# Patient Record
Sex: Male | Born: 1985 | Race: White | Hispanic: No | Marital: Married | Smoking: Former smoker
Health system: Southern US, Community
[De-identification: ages and names within clinical notes are randomized; demographics above are authoritative.]

## PROBLEM LIST (undated history)

## (undated) DIAGNOSIS — I10 Essential (primary) hypertension: Secondary | ICD-10-CM

---

## 2009-08-11 ENCOUNTER — Emergency Department (HOSPITAL_COMMUNITY): Admission: EM | Admit: 2009-08-11 | Discharge: 2009-08-11 | Payer: Self-pay | Admitting: Emergency Medicine

## 2010-10-15 ENCOUNTER — Emergency Department: Payer: Self-pay | Admitting: Emergency Medicine

## 2012-04-11 ENCOUNTER — Emergency Department: Payer: Self-pay | Admitting: Emergency Medicine

## 2012-04-11 LAB — URINALYSIS, COMPLETE
Ketone: NEGATIVE
Leukocyte Esterase: NEGATIVE
Nitrite: NEGATIVE
Protein: NEGATIVE
RBC,UR: 57 /HPF (ref 0–5)
WBC UR: 2 /HPF (ref 0–5)

## 2012-08-29 ENCOUNTER — Emergency Department: Payer: Self-pay | Admitting: Emergency Medicine

## 2013-06-25 ENCOUNTER — Other Ambulatory Visit: Payer: Self-pay | Admitting: Interventional Cardiology

## 2013-06-25 DIAGNOSIS — R079 Chest pain, unspecified: Secondary | ICD-10-CM

## 2013-06-25 DIAGNOSIS — R0602 Shortness of breath: Secondary | ICD-10-CM

## 2013-06-27 ENCOUNTER — Ambulatory Visit (HOSPITAL_COMMUNITY): Payer: 59 | Attending: Cardiology | Admitting: Cardiology

## 2013-06-27 DIAGNOSIS — R0989 Other specified symptoms and signs involving the circulatory and respiratory systems: Secondary | ICD-10-CM | POA: Insufficient documentation

## 2013-06-27 DIAGNOSIS — R0609 Other forms of dyspnea: Secondary | ICD-10-CM | POA: Insufficient documentation

## 2013-06-27 DIAGNOSIS — R079 Chest pain, unspecified: Secondary | ICD-10-CM

## 2013-06-27 DIAGNOSIS — R0602 Shortness of breath: Secondary | ICD-10-CM

## 2013-06-27 DIAGNOSIS — R0789 Other chest pain: Secondary | ICD-10-CM | POA: Insufficient documentation

## 2013-06-27 NOTE — Progress Notes (Signed)
Echo performed. 

## 2020-04-24 ENCOUNTER — Encounter (HOSPITAL_COMMUNITY): Payer: Self-pay | Admitting: Urgent Care

## 2020-04-24 ENCOUNTER — Other Ambulatory Visit: Payer: Self-pay

## 2020-04-24 ENCOUNTER — Encounter
Admission: RE | Admit: 2020-04-24 | Discharge: 2020-04-24 | Disposition: A | Discharge status: Home / Self Care | Payer: PRIVATE HEALTH INSURANCE | Source: Ambulatory Visit | Attending: Otolaryngology | Admitting: Otolaryngology

## 2020-04-24 HISTORY — DX: Essential (primary) hypertension: I10

## 2020-04-24 NOTE — Patient Instructions (Signed)
COVID TESTING Date: April 28, 2020 Monday  Testing site:  [facility] [address] Thru Hours:  9:03 am - 1:00 pm Once you are tested, you are asked to stay quarantined (avoiding public places) until after your surgery.   Your procedure is scheduled on: April 30, 2020 Wednesday  Report to Day Surgery on the 2nd floor of the [facility]. To find out your arrival time, please call [phone] between 1PM - 3PM on: TUESDAY  REMEMBER: Instructions that are not followed completely may result in serious medical risk, up to and including death; or upon the discretion of your surgeon and anesthesiologist your surgery may need to be rescheduled.  Do not eat food after midnight the night before surgery.  No gum chewing, lozengers or hard candies.  You may however, drink CLEAR liquids up to 2 hours before you are scheduled to arrive for your surgery. Do not drink anything within 2 hours of your scheduled arrival time.  Clear liquids include: - water  - apple juice without pulp - gatorade (not RED) - black coffee or tea (Do NOT add milk or creamers to the coffee or tea) Do NOT drink anything that is not on this list.  Type 1 and Type 2 diabetics should only drink water.    TAKE THESE MEDICATIONS THE MORNING OF SURGERY WITH A SIP OF WATER: NONE    Stop Anti-inflammatories (NSAIDS) such as Advil, Aleve, Ibuprofen, Motrin, Naproxen, Naprosyn and ASPIRIN OR Aspirin based products such as Excedrin, Goodys Powder, BC Powder. (May take Tylenol or Acetaminophen if needed.)  Stop ANY OVER THE COUNTER supplements until after surgery. (May continue Vitamin D, Vitamin B, and multivitamin.)  No Alcohol for 24 hours before or after surgery.  No Smoking including e-cigarettes for 24 hours prior to surgery.  No chewable tobacco products for at least 6 hours prior to surgery.  No nicotine patches on the day of surgery.  Do not use any "recreational" drugs for at  least a week prior to your surgery.  Please be advised that the combination of cocaine and anesthesia may have negative outcomes, up to and including death. If you test positive for cocaine, your surgery will be cancelled.  On the morning of surgery brush your teeth with toothpaste and water, you may rinse your mouth with mouthwash if you wish. Do not swallow any toothpaste or mouthwash.  Do not wear jewelry, make-up, hairpins, clips or nail polish.  Do not wear lotions, powders, or perfumes.   Do not shave 48 hours prior to surgery.   Contact lenses, hearing aids and dentures may not be worn into surgery.  Do not bring valuables to the hospital. [facility] is not responsible for any missing/lost belongings or valuables.   TAKE SHOWER THE MORNING OF SURGERY  Notify your doctor if there is any change in your medical condition (cold, fever, infection).  Wear comfortable clothing (specific to your surgery type) to the hospital.  Plan for stool softeners for home use; pain medications have a tendency to cause constipation. You can also help prevent constipation by eating foods high in fiber such as fruits and vegetables and drinking plenty of fluids as your diet allows.  After surgery, you can help prevent lung complications by doing breathing exercises.  Take deep breaths and cough every 1-2 hours. Your doctor may order a device called an Incentive Spirometer to help you take deep breaths. When coughing or sneezing, hold a pillow firmly against your  incision with both hands. This is called "splinting." Doing this helps protect your incision. It also decreases belly discomfort.  If you are being discharged the day of surgery, you will not be allowed to drive home. You will need a responsible adult (18 years or older) to drive you home and stay with you that night.   Please call the Pre-admissions Testing Dept. at [phone] if you have any questions about these  instructions.  Visitation Policy:  Patients undergoing a surgery or procedure may have one family member or support person with them as long as that person is not COVID-19 positive or experiencing its symptoms.  That person may remain in the waiting area during the procedure.  Children under 27 years of age may have both parents or legal guardians with them during their procedure.  Inpatient Visitation Update:   Two designated support people may visit a patient during visiting hours 7 am to 8 pm. It must be the same two designated people for the duration of the patient stay. The visitors may come and go during the day, and there is no switching out to have different visitors. A mask must be worn at all times, including in the patient room.  Children under 24 years of age:  a total of 4 designated visitors for the child's entire stay are allowed. Only 2 in the room at a time and only one staying overnight at a time. The overnight guest can now rotate during the child's hospital stay.  As a reminder, masks are still required for all [facility] team members, patients and visitors in all [facility] facilities.   Systemwide, no visitors 17 or younger.

## 2020-04-28 ENCOUNTER — Other Ambulatory Visit: Payer: Self-pay

## 2020-04-30 ENCOUNTER — Encounter: Admission: RE | Payer: Self-pay | Source: Home / Self Care

## 2020-04-30 ENCOUNTER — Ambulatory Visit
Admission: RE | Admit: 2020-04-30 | Payer: PRIVATE HEALTH INSURANCE | Source: Home / Self Care | Admitting: Otolaryngology

## 2020-04-30 SURGERY — TONSILLECTOMY
Anesthesia: General | Laterality: Bilateral

## 2021-01-16 ENCOUNTER — Other Ambulatory Visit: Payer: Self-pay | Admitting: Physical Medicine and Rehabilitation

## 2021-01-16 DIAGNOSIS — M5416 Radiculopathy, lumbar region: Secondary | ICD-10-CM

## 2021-01-30 ENCOUNTER — Other Ambulatory Visit: Payer: Self-pay

## 2021-01-30 ENCOUNTER — Ambulatory Visit
Admission: RE | Admit: 2021-01-30 | Discharge: 2021-01-30 | Disposition: A | Discharge status: Home / Self Care | Payer: PRIVATE HEALTH INSURANCE | Source: Ambulatory Visit | Attending: Physical Medicine and Rehabilitation | Admitting: Physical Medicine and Rehabilitation

## 2021-01-30 DIAGNOSIS — M5416 Radiculopathy, lumbar region: Secondary | ICD-10-CM

## 2021-03-13 ENCOUNTER — Other Ambulatory Visit: Payer: Self-pay | Admitting: Physical Medicine & Rehabilitation

## 2021-03-13 DIAGNOSIS — M25559 Pain in unspecified hip: Secondary | ICD-10-CM

## 2021-03-16 ENCOUNTER — Other Ambulatory Visit: Payer: Self-pay

## 2021-03-16 ENCOUNTER — Ambulatory Visit
Admission: RE | Admit: 2021-03-16 | Discharge: 2021-03-16 | Disposition: A | Discharge status: Home / Self Care | Payer: PRIVATE HEALTH INSURANCE | Source: Ambulatory Visit | Attending: Physical Medicine & Rehabilitation | Admitting: Physical Medicine & Rehabilitation

## 2021-03-16 DIAGNOSIS — M25559 Pain in unspecified hip: Secondary | ICD-10-CM

## 2021-03-25 ENCOUNTER — Other Ambulatory Visit: Payer: Self-pay | Admitting: Physical Medicine & Rehabilitation

## 2021-03-25 DIAGNOSIS — M25552 Pain in left hip: Secondary | ICD-10-CM

## 2021-03-31 ENCOUNTER — Inpatient Hospital Stay: Admission: RE | Admit: 2021-03-31 | Payer: PRIVATE HEALTH INSURANCE | Source: Ambulatory Visit

## 2021-04-15 ENCOUNTER — Inpatient Hospital Stay: Admission: RE | Admit: 2021-04-15 | Payer: PRIVATE HEALTH INSURANCE | Source: Ambulatory Visit

## 2021-05-01 ENCOUNTER — Other Ambulatory Visit: Payer: Self-pay

## 2021-05-01 ENCOUNTER — Ambulatory Visit
Admission: RE | Admit: 2021-05-01 | Discharge: 2021-05-01 | Disposition: A | Discharge status: Home / Self Care | Payer: PRIVATE HEALTH INSURANCE | Source: Ambulatory Visit | Attending: Physical Medicine & Rehabilitation | Admitting: Physical Medicine & Rehabilitation

## 2021-05-01 DIAGNOSIS — M25552 Pain in left hip: Secondary | ICD-10-CM

## 2021-05-01 MED ORDER — IOPAMIDOL (ISOVUE-M 200) INJECTION 41%
1.0000 mL | Freq: Once | INTRAMUSCULAR | Status: AC
  Administered 2021-05-01: 1 mL via INTRA_ARTICULAR

## 2021-05-01 MED ORDER — TRIAMCINOLONE ACETONIDE 40 MG/ML IJ SUSP (RADIOLOGY)
40.0000 mg | Freq: Once | INTRAMUSCULAR | Status: AC
  Administered 2021-05-01: 40 mg via INTRA_ARTICULAR

## 2022-03-12 ENCOUNTER — Other Ambulatory Visit: Payer: Self-pay

## 2022-03-12 MED ORDER — WEGOVY 1 MG/0.5ML ~~LOC~~ SOAJ: SUBCUTANEOUS | 3 refills | Status: AC

## 2022-10-14 DIAGNOSIS — Z0000 Encounter for general adult medical examination without abnormal findings: Secondary | ICD-10-CM | POA: Diagnosis not present

## 2022-10-15 DIAGNOSIS — M5010 Cervical disc disorder with radiculopathy, unspecified cervical region: Secondary | ICD-10-CM | POA: Diagnosis not present

## 2022-10-15 DIAGNOSIS — Z808 Family history of malignant neoplasm of other organs or systems: Secondary | ICD-10-CM | POA: Diagnosis not present

## 2022-10-15 DIAGNOSIS — Z6841 Body Mass Index (BMI) 40.0 and over, adult: Secondary | ICD-10-CM | POA: Diagnosis not present

## 2022-10-15 DIAGNOSIS — Z0000 Encounter for general adult medical examination without abnormal findings: Secondary | ICD-10-CM | POA: Diagnosis not present

## 2022-11-05 DIAGNOSIS — Z03818 Encounter for observation for suspected exposure to other biological agents ruled out: Secondary | ICD-10-CM | POA: Diagnosis not present

## 2022-11-05 DIAGNOSIS — R197 Diarrhea, unspecified: Secondary | ICD-10-CM | POA: Diagnosis not present

## 2022-11-05 DIAGNOSIS — R112 Nausea with vomiting, unspecified: Secondary | ICD-10-CM | POA: Diagnosis not present

## 2022-11-05 DIAGNOSIS — R6889 Other general symptoms and signs: Secondary | ICD-10-CM | POA: Diagnosis not present

## 2022-11-05 DIAGNOSIS — J019 Acute sinusitis, unspecified: Secondary | ICD-10-CM | POA: Diagnosis not present

## 2022-11-12 DIAGNOSIS — M5116 Intervertebral disc disorders with radiculopathy, lumbar region: Secondary | ICD-10-CM | POA: Diagnosis not present

## 2022-11-12 DIAGNOSIS — M5010 Cervical disc disorder with radiculopathy, unspecified cervical region: Secondary | ICD-10-CM | POA: Diagnosis not present

## 2022-12-22 DIAGNOSIS — M5116 Intervertebral disc disorders with radiculopathy, lumbar region: Secondary | ICD-10-CM | POA: Diagnosis not present

## 2023-02-12 IMAGING — MR MR LUMBAR SPINE W/O CM
4 of 5 series · 26 of 48 positions shown · non-contrast
Comparison: None.

CLINICAL DATA: Low back pain radiating to the bilateral legs with
weakness

EXAM:
MRI LUMBAR SPINE WITHOUT CONTRAST
TECHNIQUE: Multiplanar, multisequence MR imaging of the lumbar spine was
performed. No intravenous contrast was administered.

[Series 3: T2 · sagittal · 4.0mm · 0.57mm/px · 6 of 17 slices shown (1 of 2)]
[im 1/17]
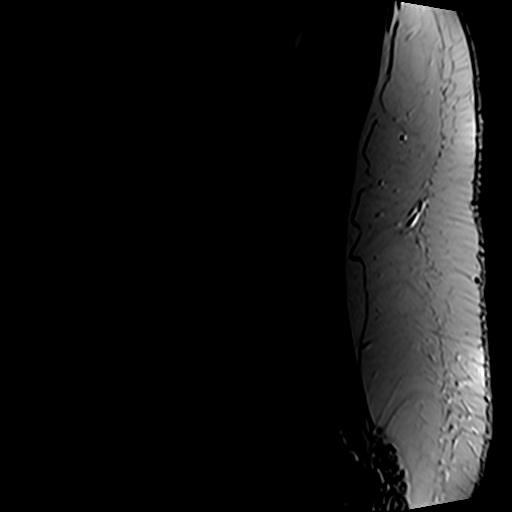
[im 4/17]
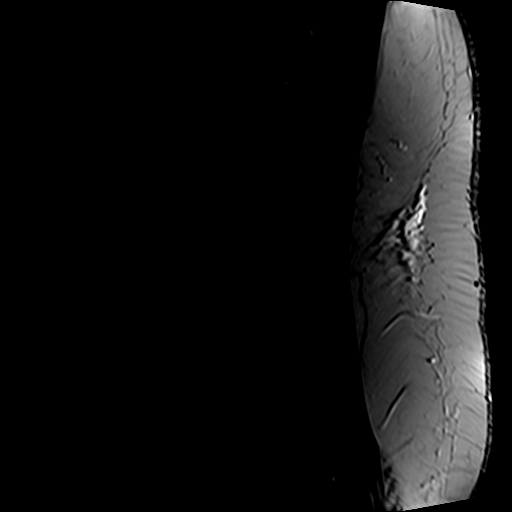
[im 7/17]
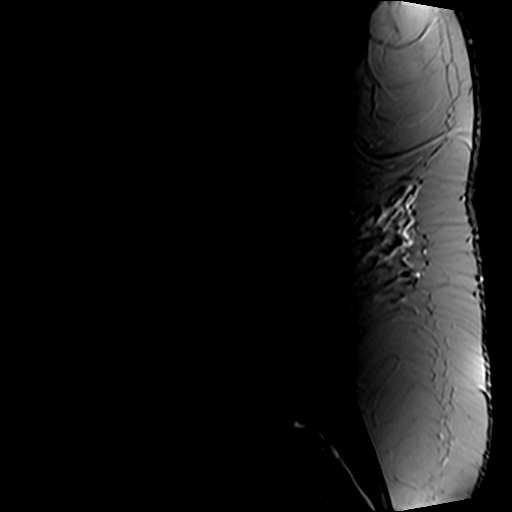
[im 10/17]
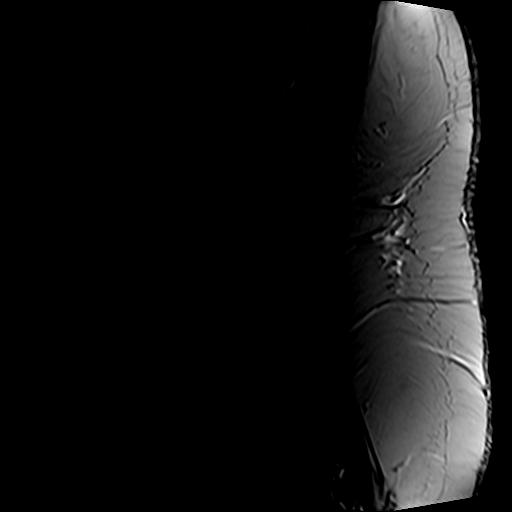
[im 13/17]
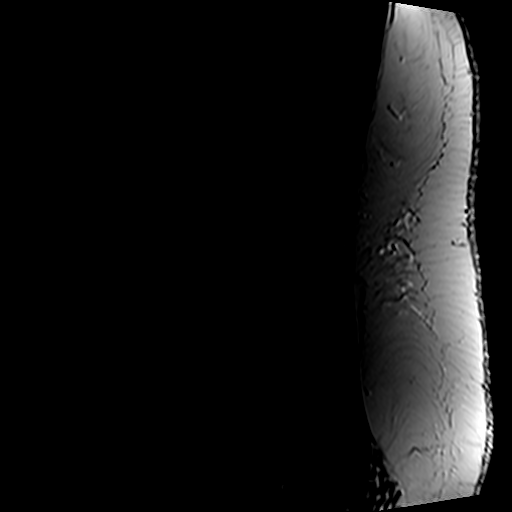
[im 17/17]
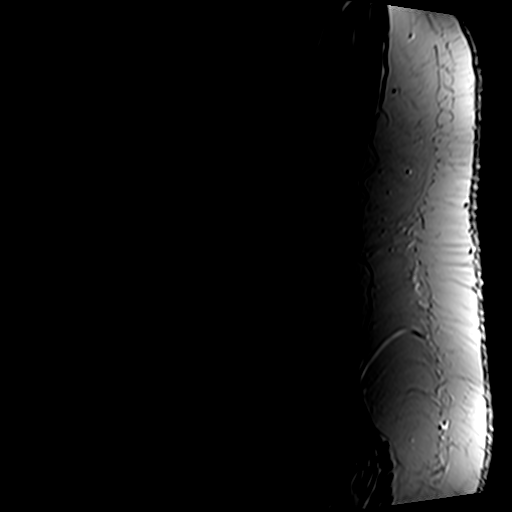

[Series 5: T1 · sagittal · 4.0mm · 0.57mm/px · 6 of 17 slices shown (1 of 2)]
[im 1/17]
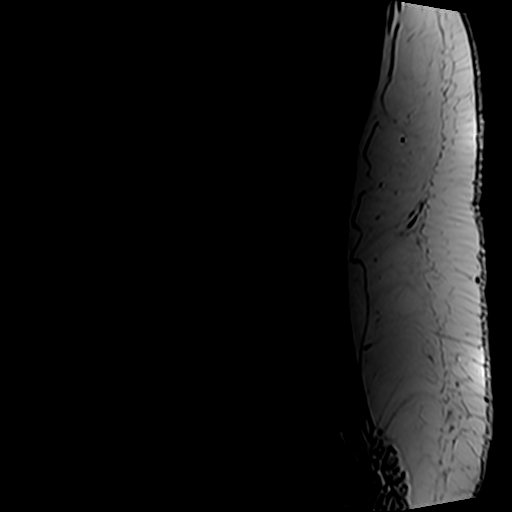
[im 4/17]
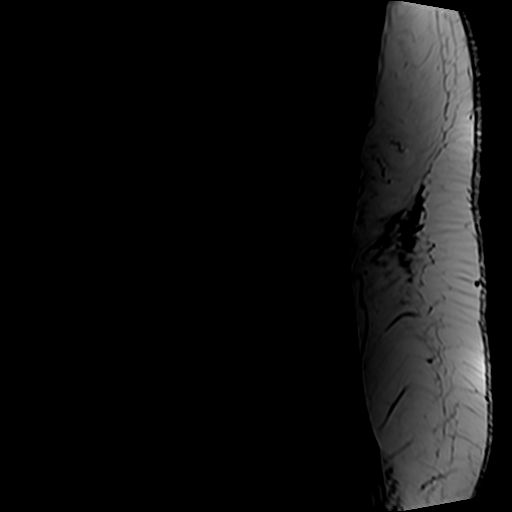
[im 7/17]
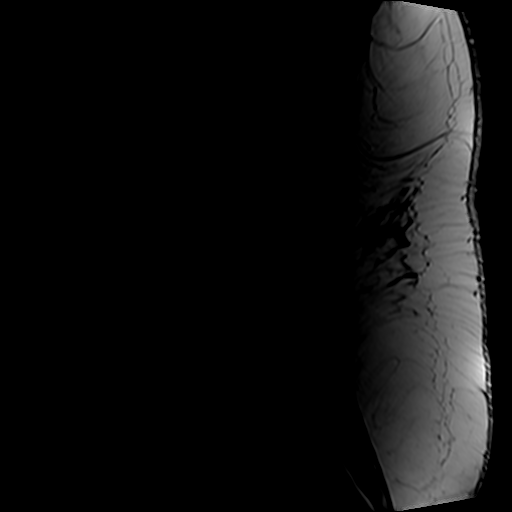
[im 10/17]
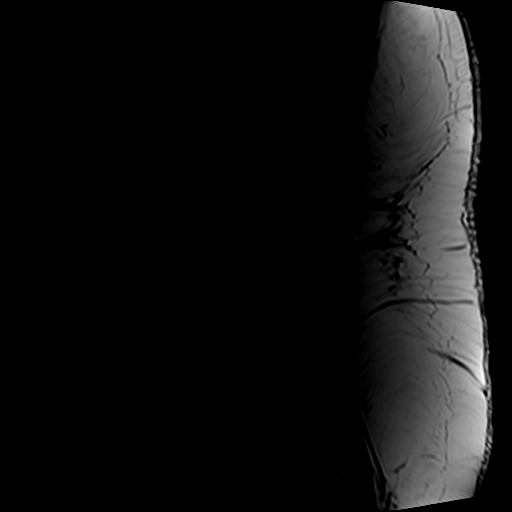
[im 13/17]
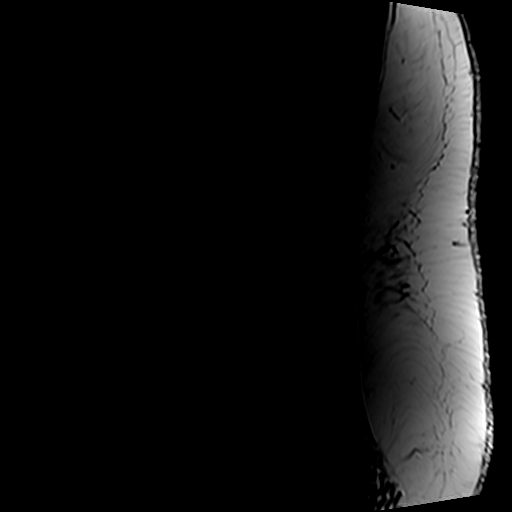
[im 17/17]
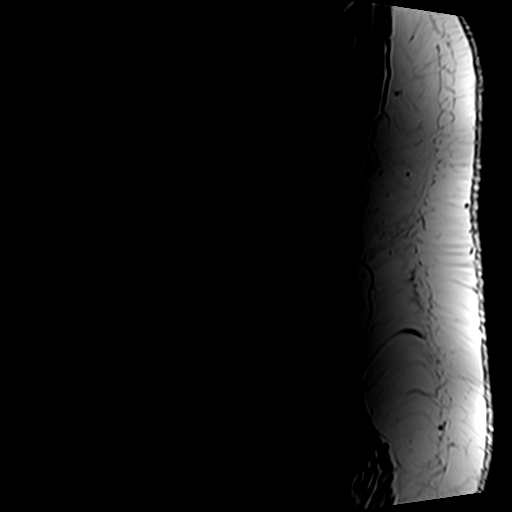

[Series 6: T1 · axial · 4.0mm · 0.35mm/px · z∈[-79,+142]mm · 5 of 46 slices shown (2 of 2)]
[im 1/46]
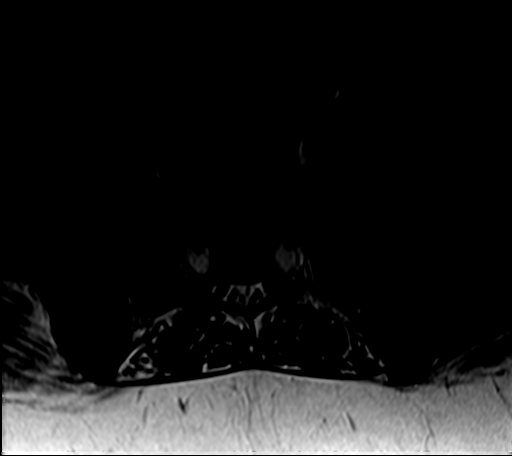
[im 7/46]
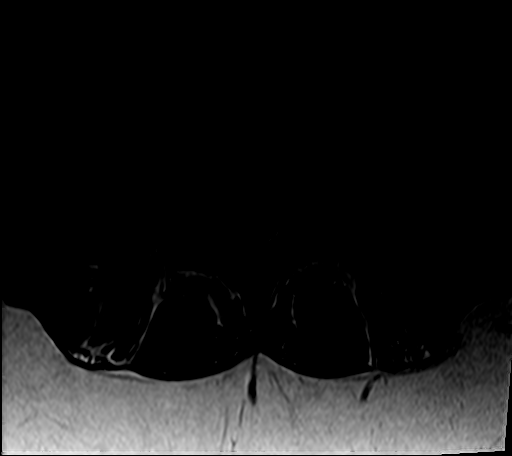
[im 13/46]
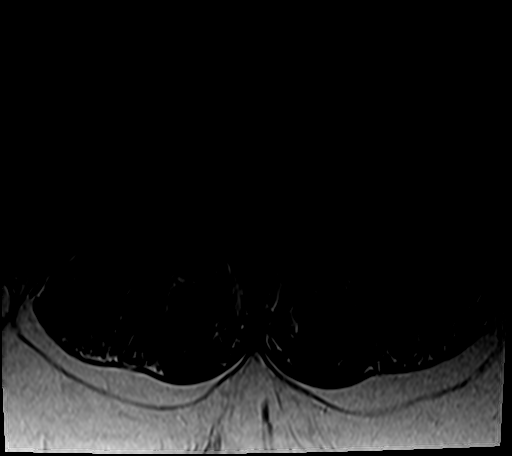
[im 23/46]
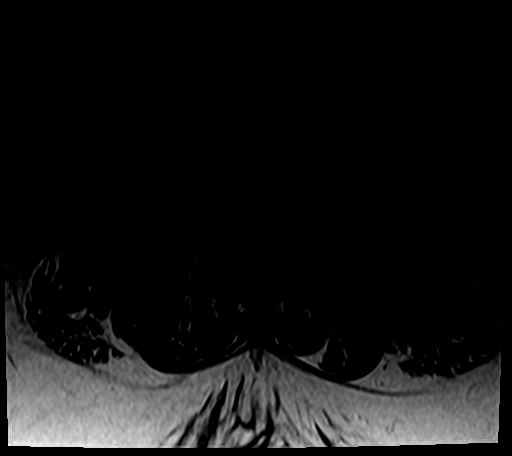
[im 39/46]
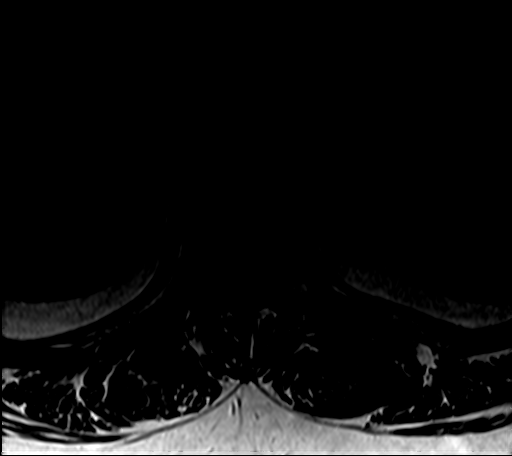

[Series 7: T2 · axial · 4.0mm · 0.70mm/px · z∈[-79,+183]mm · 9 of 46 slices shown (2 of 2)]
[im 1/46]
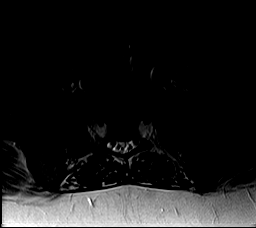
[im 7/46]
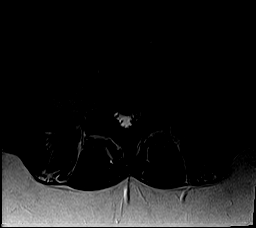
[im 13/46]
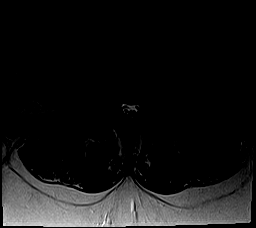
[im 20/46]
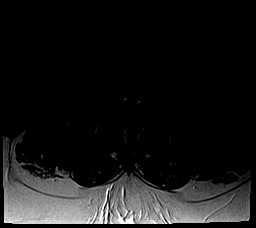
[im 23/46]
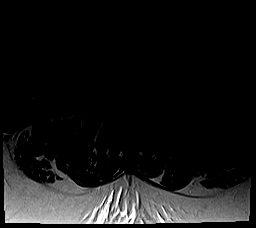
[im 26/46]
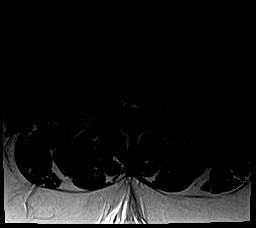
[im 33/46]
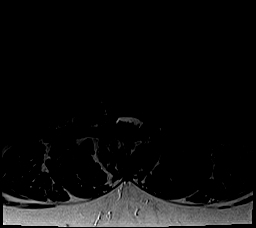
[im 39/46]
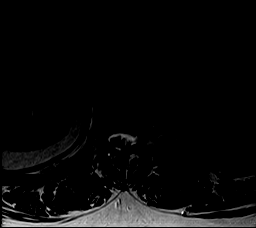
[im 46/46]
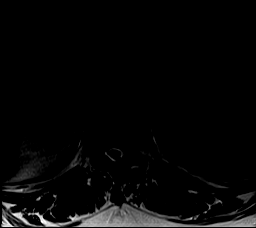

[26 of 48 positions shown; findings below may reference images not displayed]

FINDINGS: Segmentation:  5 lumbar type vertebrae

Alignment:  Physiologic.

Vertebrae:  No fracture, evidence of discitis, or bone lesion.

Conus medullaris and cauda equina: Conus extends to the T12-L1
level. Conus and cauda equina appear normal.

Paraspinal and other soft tissues: Negative

Disc levels:

T11-12: Disc narrowing with left paracentral protrusion flattening
the left cord and nerve roots.

T12- L1: Unremarkable.

L1-L2: Unremarkable.

L2-L3: Disc narrowing and bulging with posterior annular fissure and
protrusion. Degenerative facet spurring and ligamentum flavum
thickening. Advanced spinal stenosis. Patent foramina

L3-L4: Disc narrowing and bulging. Mild degenerative facet spurring
and ligamentous thickening. Moderate to advanced spinal stenosis

L4-L5: Mild degenerative facet spurring.  No compressive narrowing

L5-S1:Unremarkable.
IMPRESSION: 1. Congenitally stenotic lumbar canal from short pedicles.
2. Superimposed degenerative disease causes spinal stenosis that is
advanced at L2-3 and moderate to advanced at L3-4.
3. T11-12 left paracentral protrusion deforming the left cord.
